# Patient Record
Sex: Male | Born: 1975
Health system: Southern US, Community
[De-identification: ages and names within clinical notes are randomized; demographics above are authoritative.]

## PROBLEM LIST (undated history)

## (undated) DIAGNOSIS — I10 Essential (primary) hypertension: Secondary | ICD-10-CM

## (undated) HISTORY — PX: APPENDECTOMY: SHX54

## (undated) HISTORY — PX: ABDOMINAL SURGERY: SHX537

## (undated) HISTORY — PX: HERNIA REPAIR: SHX51

---

## 2005-04-04 ENCOUNTER — Emergency Department (HOSPITAL_COMMUNITY): Admission: EM | Admit: 2005-04-04 | Discharge: 2005-04-04 | Payer: Self-pay | Admitting: Emergency Medicine

## 2005-07-26 ENCOUNTER — Ambulatory Visit: Payer: Self-pay | Admitting: *Deleted

## 2005-07-26 ENCOUNTER — Ambulatory Visit: Payer: Self-pay | Admitting: Family Medicine

## 2005-07-28 ENCOUNTER — Ambulatory Visit (HOSPITAL_COMMUNITY): Admission: RE | Admit: 2005-07-28 | Discharge: 2005-07-28 | Payer: Self-pay | Admitting: Internal Medicine

## 2005-07-29 ENCOUNTER — Ambulatory Visit (HOSPITAL_COMMUNITY): Admission: RE | Admit: 2005-07-29 | Discharge: 2005-07-29 | Payer: Self-pay | Admitting: Internal Medicine

## 2005-08-02 ENCOUNTER — Ambulatory Visit: Payer: Self-pay | Admitting: Family Medicine

## 2005-08-15 ENCOUNTER — Ambulatory Visit: Payer: Self-pay | Admitting: Family Medicine

## 2005-09-19 ENCOUNTER — Ambulatory Visit: Payer: Self-pay | Admitting: Internal Medicine

## 2005-10-03 ENCOUNTER — Ambulatory Visit: Payer: Self-pay | Admitting: Family Medicine

## 2005-11-04 ENCOUNTER — Emergency Department (HOSPITAL_COMMUNITY): Admission: EM | Admit: 2005-11-04 | Discharge: 2005-11-04 | Payer: Self-pay | Admitting: Emergency Medicine

## 2005-11-20 ENCOUNTER — Ambulatory Visit: Payer: Self-pay | Admitting: Family Medicine

## 2005-12-31 ENCOUNTER — Emergency Department (HOSPITAL_COMMUNITY): Admission: EM | Admit: 2005-12-31 | Discharge: 2005-12-31 | Payer: Self-pay | Admitting: Emergency Medicine

## 2006-06-13 ENCOUNTER — Ambulatory Visit: Payer: Self-pay | Admitting: Family Medicine

## 2006-06-30 ENCOUNTER — Ambulatory Visit: Payer: Self-pay | Admitting: Nurse Practitioner

## 2006-06-30 ENCOUNTER — Encounter: Admission: RE | Admit: 2006-06-30 | Discharge: 2006-09-28 | Payer: Self-pay | Admitting: *Deleted

## 2007-01-07 ENCOUNTER — Encounter (INDEPENDENT_AMBULATORY_CARE_PROVIDER_SITE_OTHER): Payer: Self-pay | Admitting: *Deleted

## 2007-12-11 ENCOUNTER — Emergency Department (HOSPITAL_BASED_OUTPATIENT_CLINIC_OR_DEPARTMENT_OTHER): Admission: EM | Admit: 2007-12-11 | Discharge: 2007-12-12 | Payer: Self-pay | Admitting: Emergency Medicine

## 2008-02-25 ENCOUNTER — Emergency Department (HOSPITAL_BASED_OUTPATIENT_CLINIC_OR_DEPARTMENT_OTHER): Admission: EM | Admit: 2008-02-25 | Discharge: 2008-02-25 | Payer: Self-pay | Admitting: Emergency Medicine

## 2008-04-10 ENCOUNTER — Emergency Department (HOSPITAL_BASED_OUTPATIENT_CLINIC_OR_DEPARTMENT_OTHER): Admission: EM | Admit: 2008-04-10 | Discharge: 2008-04-10 | Payer: Self-pay | Admitting: Emergency Medicine

## 2008-04-10 ENCOUNTER — Ambulatory Visit: Payer: Self-pay | Admitting: Diagnostic Radiology

## 2008-05-24 ENCOUNTER — Ambulatory Visit: Payer: Self-pay | Admitting: Diagnostic Radiology

## 2008-05-24 ENCOUNTER — Emergency Department (HOSPITAL_BASED_OUTPATIENT_CLINIC_OR_DEPARTMENT_OTHER): Admission: EM | Admit: 2008-05-24 | Discharge: 2008-05-24 | Payer: Self-pay | Admitting: Emergency Medicine

## 2008-06-04 ENCOUNTER — Emergency Department (HOSPITAL_BASED_OUTPATIENT_CLINIC_OR_DEPARTMENT_OTHER): Admission: EM | Admit: 2008-06-04 | Discharge: 2008-06-04 | Payer: Self-pay | Admitting: Emergency Medicine

## 2008-06-04 ENCOUNTER — Ambulatory Visit: Payer: Self-pay | Admitting: Diagnostic Radiology

## 2008-07-08 ENCOUNTER — Emergency Department (HOSPITAL_BASED_OUTPATIENT_CLINIC_OR_DEPARTMENT_OTHER): Admission: EM | Admit: 2008-07-08 | Discharge: 2008-07-08 | Payer: Self-pay | Admitting: Emergency Medicine

## 2010-05-22 NOTE — Miscellaneous (Signed)
Summary: VIP  Patient: Jimmy Wiggins [facility] Note: All result statuses are Final unless otherwise noted.  Tests: (1) VIP (Medications)   LLIMPORTMEDS              "Result Below..."       RESULT: FAMOTIDINE TABS 20 MG*TAKE ONE (1) TABLET TWICE DAILY*06/13/2006*Last Refill: [id number]***   LLIMPORTALLS              NKDA***  Note: An exclamation mark (!) indicates a result that was not dispersed into the flowsheet. Document Creation Date: 02/19/2007 3:02 PM _______________________________________________________________________  (1) Order result status: Final Collection or observation date-time: 01/07/2007 Requested date-time: 01/07/2007 Receipt date-time:  Reported date-time: 01/07/2007 Referring Physician:   Ordering Physician:   Specimen Source:  Source: [name] Order Number:  Lab site:

## 2010-08-07 LAB — BASIC METABOLIC PANEL
BUN: 18 mg/dL (ref 6–23)
BUN: 24 mg/dL — ABNORMAL HIGH (ref 6–23)
CO2: 23 mEq/L (ref 19–32)
CO2: 25 mEq/L (ref 19–32)
Calcium: 8.9 mg/dL (ref 8.4–10.5)
Calcium: 9.3 mg/dL (ref 8.4–10.5)
Chloride: 105 mEq/L (ref 96–112)
Chloride: 105 mEq/L (ref 96–112)
Creatinine, Ser: 0.7 mg/dL (ref 0.4–1.5)
Creatinine, Ser: 1 mg/dL (ref 0.4–1.5)
GFR calc Af Amer: >60 mL/min (ref >60)
GFR calc Af Amer: >60 mL/min (ref >60)
GFR calc non Af Amer: >60 mL/min (ref >60)
GFR calc non Af Amer: >60 mL/min (ref >60)
Glucose, Bld: 93 mg/dL (ref 70–99)
Glucose, Bld: 99 mg/dL (ref 70–99)
Potassium: 4 mEq/L (ref 3.5–5.1)
Potassium: 4.1 mEq/L (ref 3.5–5.1)
Sodium: 138 mEq/L (ref 135–145)
Sodium: 140 mEq/L (ref 135–145)

## 2010-08-07 LAB — CBC
HCT: 45.1 % (ref 39.0–52.0)
HCT: 45.8 % (ref 39.0–52.0)
Hemoglobin: 15.7 g/dL (ref 13.0–17.0)
Hemoglobin: 15.8 g/dL (ref 13.0–17.0)
MCHC: 34.3 g/dL (ref 30.0–36.0)
MCHC: 34.9 g/dL (ref 30.0–36.0)
MCV: 93 fL (ref 78.0–100.0)
MCV: 93.1 fL (ref 78.0–100.0)
Platelets: 276 10*3/uL (ref 150–400)
Platelets: 283 10*3/uL (ref 150–400)
RBC: 4.86 MIL/uL (ref 4.22–5.81)
RBC: 4.92 MIL/uL (ref 4.22–5.81)
RDW: 11.8 % (ref 11.5–15.5)
RDW: 12 % (ref 11.5–15.5)
WBC: 13.6 10*3/uL — ABNORMAL HIGH (ref 4.0–10.5)
WBC: 13.8 10*3/uL — ABNORMAL HIGH (ref 4.0–10.5)

## 2010-08-07 LAB — POCT CARDIAC MARKERS
CKMB, poc: 3.9 ng/mL (ref 1.0–8.0)
CKMB, poc: 5.2 ng/mL (ref 1.0–8.0)
CKMB, poc: <1 ng/mL — ABNORMAL LOW (ref 1.0–8.0)
CKMB, poc: <1 ng/mL — ABNORMAL LOW (ref 1.0–8.0)
Myoglobin, poc: 279 ng/mL (ref 12–200)
Myoglobin, poc: 32.1 ng/mL (ref 12–200)
Myoglobin, poc: 38.2 ng/mL (ref 12–200)
Myoglobin, poc: 498 ng/mL (ref 12–200)
Troponin i, poc: <0.05 ng/mL (ref 0.00–0.09)
Troponin i, poc: <0.05 ng/mL (ref 0.00–0.09)
Troponin i, poc: <0.05 ng/mL (ref 0.00–0.09)
Troponin i, poc: <0.05 ng/mL (ref 0.00–0.09)

## 2010-08-07 LAB — DIFFERENTIAL
Basophils Absolute: 0.1 10*3/uL (ref 0.0–0.1)
Basophils Absolute: 0.3 10*3/uL — ABNORMAL HIGH (ref 0.0–0.1)
Basophils Relative: 1 % (ref 0–1)
Basophils Relative: 2 % — ABNORMAL HIGH (ref 0–1)
Eosinophils Absolute: 0.4 10*3/uL (ref 0.0–0.7)
Eosinophils Absolute: 0.5 10*3/uL (ref 0.0–0.7)
Eosinophils Relative: 3 % (ref 0–5)
Eosinophils Relative: 3 % (ref 0–5)
Lymphocytes Relative: 20 % (ref 12–46)
Lymphocytes Relative: 28 % (ref 12–46)
Lymphs Abs: 2.8 10*3/uL (ref 0.7–4.0)
Lymphs Abs: 3.8 10*3/uL (ref 0.7–4.0)
Monocytes Absolute: 0.7 10*3/uL (ref 0.1–1.0)
Monocytes Absolute: 1.3 10*3/uL — ABNORMAL HIGH (ref 0.1–1.0)
Monocytes Relative: 5 % (ref 3–12)
Monocytes Relative: 9 % (ref 3–12)
Neutro Abs: 8.5 10*3/uL — ABNORMAL HIGH (ref 1.7–7.7)
Neutro Abs: 9 10*3/uL — ABNORMAL HIGH (ref 1.7–7.7)
Neutrophils Relative %: 63 % (ref 43–77)
Neutrophils Relative %: 66 % (ref 43–77)

## 2010-08-07 LAB — HEMOCCULT GUIAC POC 1CARD (OFFICE): Fecal Occult Bld: NEGATIVE

## 2010-08-07 LAB — MONONUCLEOSIS SCREEN: Mono Screen: NEGATIVE

## 2010-08-07 LAB — ETHANOL: Alcohol, Ethyl (B): <5 mg/dL (ref 0–10)

## 2010-08-09 ENCOUNTER — Emergency Department (HOSPITAL_BASED_OUTPATIENT_CLINIC_OR_DEPARTMENT_OTHER)
Admission: EM | Admit: 2010-08-09 | Discharge: 2010-08-09 | Disposition: A | Discharge status: Home / Self Care | Payer: Self-pay | Attending: Emergency Medicine | Admitting: Emergency Medicine

## 2010-08-09 DIAGNOSIS — I10 Essential (primary) hypertension: Secondary | ICD-10-CM | POA: Insufficient documentation

## 2010-08-09 DIAGNOSIS — K0381 Cracked tooth: Secondary | ICD-10-CM | POA: Insufficient documentation

## 2010-08-09 DIAGNOSIS — F17200 Nicotine dependence, unspecified, uncomplicated: Secondary | ICD-10-CM | POA: Insufficient documentation

## 2010-08-09 DIAGNOSIS — K089 Disorder of teeth and supporting structures, unspecified: Secondary | ICD-10-CM | POA: Insufficient documentation

## 2010-08-09 LAB — BASIC METABOLIC PANEL
BUN: 19 mg/dL (ref 6–23)
CO2: 23 mEq/L (ref 19–32)
Calcium: 9.1 mg/dL (ref 8.4–10.5)
Chloride: 107 mEq/L (ref 96–112)
Creatinine, Ser: 0.7 mg/dL (ref 0.4–1.5)
GFR calc Af Amer: >60 mL/min (ref >60)
GFR calc non Af Amer: >60 mL/min (ref >60)
Glucose, Bld: 114 mg/dL — ABNORMAL HIGH (ref 70–99)
Potassium: 4 mEq/L (ref 3.5–5.1)
Sodium: 144 mEq/L (ref 135–145)

## 2010-10-30 ENCOUNTER — Emergency Department (INDEPENDENT_AMBULATORY_CARE_PROVIDER_SITE_OTHER): Payer: Medicaid Other

## 2010-10-30 ENCOUNTER — Emergency Department (HOSPITAL_BASED_OUTPATIENT_CLINIC_OR_DEPARTMENT_OTHER)
Admission: EM | Admit: 2010-10-30 | Discharge: 2010-10-30 | Disposition: A | Discharge status: Home / Self Care | Payer: Medicaid Other | Attending: Emergency Medicine | Admitting: Emergency Medicine

## 2010-10-30 ENCOUNTER — Encounter: Payer: Self-pay | Admitting: *Deleted

## 2010-10-30 DIAGNOSIS — R930 Abnormal findings on diagnostic imaging of skull and head, not elsewhere classified: Secondary | ICD-10-CM

## 2010-10-30 DIAGNOSIS — F17200 Nicotine dependence, unspecified, uncomplicated: Secondary | ICD-10-CM | POA: Insufficient documentation

## 2010-10-30 DIAGNOSIS — R51 Headache: Secondary | ICD-10-CM

## 2010-10-30 DIAGNOSIS — X58XXXA Exposure to other specified factors, initial encounter: Secondary | ICD-10-CM

## 2010-10-30 DIAGNOSIS — S0003XA Contusion of scalp, initial encounter: Secondary | ICD-10-CM | POA: Insufficient documentation

## 2010-10-30 DIAGNOSIS — H66019 Acute suppurative otitis media with spontaneous rupture of ear drum, unspecified ear: Secondary | ICD-10-CM

## 2010-10-30 DIAGNOSIS — IMO0002 Reserved for concepts with insufficient information to code with codable children: Secondary | ICD-10-CM | POA: Insufficient documentation

## 2010-10-30 DIAGNOSIS — I10 Essential (primary) hypertension: Secondary | ICD-10-CM | POA: Insufficient documentation

## 2010-10-30 DIAGNOSIS — Y9289 Other specified places as the place of occurrence of the external cause: Secondary | ICD-10-CM | POA: Insufficient documentation

## 2010-10-30 HISTORY — DX: Essential (primary) hypertension: I10

## 2010-10-30 MED ORDER — AMOXICILLIN 500 MG PO CAPS: 500.0000 mg | ORAL_CAPSULE | Freq: Three times a day (TID) | ORAL | Status: AC

## 2010-10-30 MED ORDER — NEOMYCIN-POLYMYXIN-HC 3.5-10000-1 OT SUSP: 4.0000 [drp] | Freq: Three times a day (TID) | OTIC | Status: AC

## 2010-10-30 NOTE — ED Notes (Signed)
States he was hit in the head with a metal pipe a few weeks ago. Since that time he has had headaches and fluid coming out of his ears.

## 2010-10-30 NOTE — ED Provider Notes (Signed)
History     Chief Complaint  Patient presents with  . Headache   Patient is a 35 y.o. male presenting with head injury. The history is provided by the patient.  Head Injury  Incident onset: 2.5 weeks ago. He came to the ER via walk-in. The injury mechanism was a direct blow. He lost consciousness for a period of less than one minute. There was no blood loss. The quality of the pain is described as sharp. The pain is at a severity of 4/10. The pain is mild. The pain has been fluctuating since the injury. Pertinent negatives include no numbness, no blurred vision, no vomiting, no tinnitus, no disorientation and no weakness. Associated symptoms comments: Headaches since injury and about 2 weeks ago right ear pain and drainage. He has tried NSAIDs for the symptoms. The treatment provided no relief.  Pt was at work pulling on a pipe and the pipe flew back and hit him in the left side of the head.  Past Medical History  Diagnosis Date  . Hypertension     Past Surgical History  Procedure Date  . Abdominal surgery   . Appendectomy   . Hernia repair     No family history on file.  History  Substance Use Topics  . Smoking status: Current Everyday Smoker -- 1.0 packs/day  . Smokeless tobacco: Never Used  . Alcohol Use: No      Review of Systems  HENT: Positive for ear pain and ear discharge. Negative for tinnitus.        Hearing loss on the right  Eyes: Negative for blurred vision.  Gastrointestinal: Negative for vomiting.  Neurological: Negative for weakness and numbness.  All other systems reviewed and are negative.    Physical Exam  BP 229/128  Pulse 70  Temp(Src) 97.9 F (36.6 C) (Oral)  Resp 20  SpO2 99%  Physical Exam  Nursing note and vitals reviewed. Constitutional: He is oriented to person, place, and time. He appears well-developed and well-nourished. No distress.  HENT:  Head: Normocephalic.  Right Ear: There is drainage and swelling. Tympanic membrane is  perforated. No middle ear effusion. Decreased hearing is noted.  Left Ear: External ear normal.  Nose: Nose normal.       Exudate in the right ear canal and mild tenderness to movement of auricle, small healing hematoma to the left upper forehead  Eyes: EOM are normal. Pupils are equal, round, and reactive to light.  Neck: Normal range of motion. Neck supple.  Musculoskeletal: He exhibits no edema and no tenderness.  Neurological: He is alert and oriented to person, place, and time. No cranial nerve deficit.  Skin: Skin is warm and dry.    ED Course  Procedures  MDM Pt with injury to the head with a pipe over 2 weeks ago and was dazed and maybe brief LOC but since has had intermittant HA almost every day not worse with any certain position and no visual or neuro changes but states also about 2 weeks ago has stated to have drainage out of the right ear with pain.  Healing hematoma over the left forehead.  Right ear with exudate in the canal and signs of perforated TM. Will get head CT to evaluated where injured and will treat for Otitis External/media.  CT neg.  Will place on abx for ear.    Gwyneth Sprout, MD 10/30/10 2045

## 2011-01-22 LAB — COMPREHENSIVE METABOLIC PANEL
ALT: 23
AST: 25
Albumin: 4.5
Alkaline Phosphatase: 69
BUN: 21
CO2: 27
Calcium: 9.8
Chloride: 104
Creatinine, Ser: 0.9
GFR calc Af Amer: >60
GFR calc non Af Amer: >60
Glucose, Bld: 93
Potassium: 4
Sodium: 140
Total Bilirubin: 1.2
Total Protein: 7.3

## 2011-02-04 ENCOUNTER — Emergency Department (HOSPITAL_COMMUNITY)
Admission: EM | Admit: 2011-02-04 | Discharge: 2011-02-04 | Disposition: A | Discharge status: Home / Self Care | Payer: Medicaid Other | Attending: Emergency Medicine | Admitting: Emergency Medicine

## 2011-02-04 DIAGNOSIS — R51 Headache: Secondary | ICD-10-CM | POA: Insufficient documentation

## 2011-02-04 DIAGNOSIS — Z79899 Other long term (current) drug therapy: Secondary | ICD-10-CM | POA: Insufficient documentation

## 2011-02-04 DIAGNOSIS — I10 Essential (primary) hypertension: Secondary | ICD-10-CM | POA: Insufficient documentation

## 2011-02-04 LAB — POCT I-STAT, CHEM 8
BUN: 19 mg/dL (ref 6–23)
Calcium, Ion: 1.19 mmol/L (ref 1.12–1.32)
Chloride: 105 mEq/L (ref 96–112)
Creatinine, Ser: 0.9 mg/dL (ref 0.50–1.35)
Glucose, Bld: 137 mg/dL — ABNORMAL HIGH (ref 70–99)
HCT: 50 % (ref 39.0–52.0)
Hemoglobin: 17 g/dL (ref 13.0–17.0)
Potassium: 3.9 mEq/L (ref 3.5–5.1)
Sodium: 139 mEq/L (ref 135–145)
TCO2: 23 mmol/L (ref 0–100)

## 2011-02-04 LAB — URINALYSIS, ROUTINE W REFLEX MICROSCOPIC
Bilirubin Urine: NEGATIVE
Glucose, UA: NEGATIVE mg/dL
Hgb urine dipstick: NEGATIVE
Ketones, ur: NEGATIVE mg/dL
Leukocytes, UA: NEGATIVE
Nitrite: NEGATIVE
Protein, ur: NEGATIVE mg/dL
Specific Gravity, Urine: 1.03 (ref 1.005–1.030)
Urobilinogen, UA: 1 mg/dL (ref 0.0–1.0)
pH: 6 (ref 5.0–8.0)

## 2011-06-10 ENCOUNTER — Emergency Department (INDEPENDENT_AMBULATORY_CARE_PROVIDER_SITE_OTHER): Payer: Medicaid Other

## 2011-06-10 ENCOUNTER — Encounter (HOSPITAL_BASED_OUTPATIENT_CLINIC_OR_DEPARTMENT_OTHER): Payer: Self-pay

## 2011-06-10 ENCOUNTER — Emergency Department (HOSPITAL_BASED_OUTPATIENT_CLINIC_OR_DEPARTMENT_OTHER)
Admission: EM | Admit: 2011-06-10 | Discharge: 2011-06-10 | Disposition: A | Discharge status: Home / Self Care | Payer: Medicaid Other | Attending: Emergency Medicine | Admitting: Emergency Medicine

## 2011-06-10 DIAGNOSIS — M7989 Other specified soft tissue disorders: Secondary | ICD-10-CM

## 2011-06-10 DIAGNOSIS — M25569 Pain in unspecified knee: Secondary | ICD-10-CM

## 2011-06-10 DIAGNOSIS — M1990 Unspecified osteoarthritis, unspecified site: Secondary | ICD-10-CM

## 2011-06-10 DIAGNOSIS — M1710 Unilateral primary osteoarthritis, unspecified knee: Secondary | ICD-10-CM | POA: Insufficient documentation

## 2011-06-10 DIAGNOSIS — IMO0002 Reserved for concepts with insufficient information to code with codable children: Secondary | ICD-10-CM

## 2011-06-10 DIAGNOSIS — F17200 Nicotine dependence, unspecified, uncomplicated: Secondary | ICD-10-CM | POA: Insufficient documentation

## 2011-06-10 DIAGNOSIS — I10 Essential (primary) hypertension: Secondary | ICD-10-CM

## 2011-06-10 LAB — BASIC METABOLIC PANEL WITH GFR
BUN: 18 mg/dL (ref 6–23)
CO2: 28 meq/L (ref 19–32)
Calcium: 9.5 mg/dL (ref 8.4–10.5)
Chloride: 104 meq/L (ref 96–112)
Creatinine, Ser: 1 mg/dL (ref 0.50–1.35)
GFR calc Af Amer: >90 mL/min
GFR calc non Af Amer: >90 mL/min
Glucose, Bld: 162 mg/dL — ABNORMAL HIGH (ref 70–99)
Potassium: 4 meq/L (ref 3.5–5.1)
Sodium: 141 meq/L (ref 135–145)

## 2011-06-10 LAB — CBC
HCT: 44.1 % (ref 39.0–52.0)
Hemoglobin: 15.4 g/dL (ref 13.0–17.0)
MCH: 32 pg (ref 26.0–34.0)
MCHC: 34.9 g/dL (ref 30.0–36.0)
MCV: 91.7 fL (ref 78.0–100.0)
Platelets: 267 K/uL (ref 150–400)
RBC: 4.81 MIL/uL (ref 4.22–5.81)
RDW: 13 % (ref 11.5–15.5)
WBC: 13.2 K/uL — ABNORMAL HIGH (ref 4.0–10.5)

## 2011-06-10 MED ORDER — OXYCODONE-ACETAMINOPHEN 5-325 MG PO TABS: 2.0000 | ORAL_TABLET | ORAL | Status: AC | PRN

## 2011-06-10 MED ORDER — OXYCODONE-ACETAMINOPHEN 5-325 MG PO TABS
2.0000 | ORAL_TABLET | Freq: Once | ORAL | Status: AC
  Administered 2011-06-10: 2 via ORAL
  Filled 2011-06-10: qty 2

## 2011-06-10 MED ORDER — HYDROCHLOROTHIAZIDE 25 MG PO TABS
12.5000 mg | ORAL_TABLET | Freq: Once | ORAL | Status: AC
  Administered 2011-06-10: 09:00:00 via ORAL
  Filled 2011-06-10: qty 1

## 2011-06-10 MED ORDER — LISINOPRIL 10 MG PO TABS
10.0000 mg | ORAL_TABLET | Freq: Once | ORAL | Status: AC
  Administered 2011-06-10: 10 mg via ORAL
  Filled 2011-06-10: qty 1

## 2011-06-10 NOTE — Discharge Instructions (Signed)
Mr. Jimmy Wiggins he has osteoarthritis in your right knee. He can take Tylenol up to 900 mg every 6 hours for this. He can take the Percocet if you have trouble sleeping at night because of the pain. Do not drive with Percocet. Your blood pressure in the ER today was 201/123. We gave you lisinopril and hydrochlorothiazide your regular hypertension medications which brought it down to 180/102. Get a primary care provider to follow you for your hypertension and osteoarthritis. Please take your hypertension medications see don't have a stroke. Return for severe pain weakness or any other concerns.

## 2011-06-10 NOTE — ED Notes (Signed)
Patient transported to X-ray 

## 2011-06-10 NOTE — ED Provider Notes (Signed)
Patient seen and examined, mild tenderness right knee.  X-rays c.w. Osteoarthritis.   Hilario Quarry, MD 06/10/11 [phone]

## 2011-06-10 NOTE — ED Provider Notes (Signed)
History     CSN: [id number]  Arrival date & time 06/10/11  0756   None     Chief Complaint  Patient presents with  . Knee Pain    (Consider location/radiation/quality/duration/timing/severity/associated sxs/prior treatment) Patient is a 36 y.o. male presenting with knee pain. The history is provided by the patient. No language interpreter was used.  Knee Pain This is a new problem. The current episode started 1 to 4 weeks ago. The problem occurs daily. The problem has been gradually worsening. Associated symptoms include arthralgias. Pertinent negatives include no chest pain, fever, joint swelling, numbness or weakness.   Patient presents today with right knee pain x2 weeks it is progressively getting worse. 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 States that the pain is worse at the end of the day. He is taking ibuprofen Tylenol and Aleve. The Aleve seems to help the pain some but the pain is now waking him up at night even when his knees touch each other. Blood pressure today is 201/123. Patient states to be on lisinopril and hydrochlorothiazide but has failed to take it for 2 weeks because it makes him feel  "dizzy". States that he does have Medicaid but they sent him to a woman[facility] clinic because they thought he was a male. Is in the process of getting of PCP. We will get films of his knee and try to get his blood pressure under control in the ER today. We will also check an i-STAT to make sure his renal function is normal. Negative for stroke like symptoms. Neuro in tact. Patient having pain with palpatation to lateral and superior patella.  Also pain with external rotation of the R foot.   Past Medical History  Diagnosis Date  . Hypertension     Past Surgical History  Procedure Date  . Abdominal surgery   . Appendectomy   . Hernia repair     No family history on file.  History  Substance Use Topics  . Smoking status: Current Everyday Smoker --  1.0 packs/day  . Smokeless tobacco: Never Used  . Alcohol Use: No      Review of Systems  Constitutional: Negative for fever.  Cardiovascular: Negative for chest pain and palpitations.  Musculoskeletal: Positive for arthralgias. Negative for back pain, joint swelling and gait problem.  Neurological: Negative for weakness and numbness.  All other systems reviewed and are negative.    Allergies  Review of patient's allergies indicates no known allergies.  Home Medications   Current Outpatient Rx  Name Route Sig Dispense Refill  . LISINOPRIL PO Oral Take by mouth.      BP 201/123  Pulse 81  Temp(Src) 98.4 F (36.9 C) (Oral)  Resp 16  Ht 6' (1.829 m)  Wt 225 lb (102.059 kg)  BMI 30.52 kg/m2  SpO2 99%  Physical Exam  Nursing note and vitals reviewed. Constitutional: He is oriented to person, place, and time. He appears well-developed and well-nourished.       obese  HENT:  Head: Normocephalic and atraumatic.  Eyes: Pupils are equal, round, and reactive to light.  Neck: Normal range of motion. Neck supple.  Cardiovascular: Normal rate and regular rhythm.  Exam reveals no gallop and no friction rub.   No murmur heard. Pulmonary/Chest: Breath sounds normal. No respiratory distress.  Abdominal: Soft. He exhibits no distension.  Musculoskeletal: Normal range of motion. He exhibits tenderness. He exhibits no edema.  Right knee tenderness with palpitation to the left lateral patella and superior patella. Pain with external rotation of the right foot.  Neurological: He is alert and oriented to person, place, and time. No cranial nerve deficit.  Skin: Skin is warm and dry.  Psychiatric: He has a normal mood and affect.    ED Course  Procedures (including critical care time)  Labs Reviewed - No data to display No results found.   No diagnosis found.    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 X-ray shows osteoarthritis. Noncompliant  with blood pressure medication x 2 weeks because it makes him feel "weird"  Initial blood pressure was 201/123. No  Stroke like symptoms shortness of breath or chest pain. Continue lisinopril and hydrochlorothiazide and his blood pressure came down to 180/103. Plans to get a PCP this week. Return if severe pain or weakness. Will take Tylenol or Percocet for his osteoarthritis.  Labs Reviewed  BASIC METABOLIC PANEL - Abnormal; Notable for the following:    Glucose, Bld 162 (*)    All other components within normal limits  CBC - Abnormal; Notable for the following:    WBC 13.2 (*)    All other components within normal limits        Jethro Bastos, NP 06/10/11 1036  Jethro Bastos, NP 06/10/11 1036

## 2011-06-10 NOTE — ED Notes (Signed)
Pt c/o R knee pain for past couple of weeks.  Denies injury.  Pt states he has been using Aleve with minimal relief.  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

## 2017-08-04 ENCOUNTER — Emergency Department (INDEPENDENT_AMBULATORY_CARE_PROVIDER_SITE_OTHER): Payer: BLUE CROSS/BLUE SHIELD

## 2017-08-04 ENCOUNTER — Encounter: Payer: Self-pay | Admitting: Emergency Medicine

## 2017-08-04 ENCOUNTER — Emergency Department (INDEPENDENT_AMBULATORY_CARE_PROVIDER_SITE_OTHER)
Admission: EM | Admit: 2017-08-04 | Discharge: 2017-08-04 | Disposition: A | Discharge status: Home / Self Care | Payer: BLUE CROSS/BLUE SHIELD | Source: Home / Self Care | Attending: Family Medicine | Admitting: Family Medicine

## 2017-08-04 DIAGNOSIS — M1711 Unilateral primary osteoarthritis, right knee: Secondary | ICD-10-CM

## 2017-08-04 DIAGNOSIS — M25461 Effusion, right knee: Secondary | ICD-10-CM

## 2017-08-04 DIAGNOSIS — I10 Essential (primary) hypertension: Secondary | ICD-10-CM

## 2017-08-04 MED ORDER — HYDROCHLOROTHIAZIDE 12.5 MG PO TABS: 12.5000 mg | ORAL_TABLET | ORAL | 1 refills | Status: DC

## 2017-08-04 MED ORDER — HYDROCODONE-ACETAMINOPHEN 5-325 MG PO TABS: 1.0000 | ORAL_TABLET | Freq: Four times a day (QID) | ORAL | 0 refills | Status: AC | PRN

## 2017-08-04 NOTE — ED Provider Notes (Signed)
[facility]    CSN: [id number] Arrival date & time: 08/04/17  1738     History   Chief Complaint Chief Complaint  Patient presents with  . Knee Pain    HPI Jimmy Wiggins is a 42 y.o. male.   Patient complains of gradually increasing right knee pain/swelling for about a month, with no preceding injury.  The knee often feels if it may lock up.  He has been wearing an OTC brace which has not been very helpful.  He works in [employer], and is having difficulty working because of knee pain. He admits that he has a long history of hypertension but has not been able to tolerated various anti-hypertensive medications.  He last took lisinopril (not taking now) and he discontinued it because he felt dizzy and light-headed with rapid movement, especially standing.  He denies chest pain, shortness of breath, palpitations.  He continues to smoke.  The history is provided by the patient and the spouse.  Knee Pain  Location:  Knee Time since incident:  1 month Injury: no   Knee location:  R knee Pain details:    Quality:  Aching   Radiates to:  Does not radiate   Severity:  Moderate   Onset quality:  Gradual   Duration:  1 month   Timing:  Constant   Progression:  Worsening Chronicity:  Recurrent Dislocation: no   Prior injury to area:  No Relieved by:  Nothing Worsened by:  Bearing weight, flexion and extension Associated symptoms: decreased ROM, stiffness and swelling   Associated symptoms: no back pain, no fatigue, no fever, no muscle weakness, no numbness and no tingling     Past Medical History:  Diagnosis Date  . Hypertension     There are no active problems to display for this patient.   Past Surgical History:  Procedure Laterality Date  . ABDOMINAL SURGERY    . APPENDECTOMY    . HERNIA REPAIR         Home Medications    Prior to Admission medications   Medication Sig Start Date End Date Taking? Authorizing Provider  hydrochlorothiazide (HYDRODIURIL)  12.5 MG tablet Take 1 tablet (12.5 mg total) by mouth every morning. 08/04/17   Lattie HawBeese, Kyndall Chaplin A, MD  LISINOPRIL PO Take by mouth.    [provider]    Family History History reviewed. No pertinent family history.  Social History Social History   Tobacco Use  . Smoking status: Current Every Day Smoker    Packs/day: 1.00  . Smokeless tobacco: Never Used  Substance Use Topics  . Alcohol use: Yes  . Drug use: No     Allergies   Patient has no known allergies.   Review of Systems Review of Systems  Constitutional: Negative for fatigue and fever.  Respiratory: Negative for cough, chest tightness, shortness of breath and wheezing.   Cardiovascular: Negative for chest pain, palpitations and leg swelling.  Musculoskeletal: Positive for stiffness. Negative for back pain.  All other systems reviewed and are negative.    Physical Exam Triage Vital Signs ED Triage Vitals  Enc Vitals Group     BP 08/04/17 1806 (!) 249/154     Pulse Rate 08/04/17 1806 68     Resp --      Temp 08/04/17 1806 98 F (36.7 C)     Temp Source 08/04/17 1806 Oral     SpO2 08/04/17 1806 98 %     Weight 08/04/17 1807 236 lb (107  kg)     Height --      Head Circumference --      Peak Flow --      Pain Score 08/04/17 1807 9     Pain Loc --      Pain Edu? --      Excl. in GC? --    No data found.  Updated Vital Signs BP (!) 249/154 (BP Location: Right Arm) Comment: physician notified   Pulse 68   Temp 98 F (36.7 C) (Oral)   Wt 236 lb (107 kg)   SpO2 98%   BMI 32.01 kg/m   Visual Acuity Right Eye Distance:   Left Eye Distance:   Bilateral Distance:    Right Eye Near:   Left Eye Near:    Bilateral Near:     Physical Exam  Constitutional: He appears well-developed and well-nourished. No distress.  HENT:  Head: Normocephalic.  Eyes: Pupils are equal, round, and reactive to light.  Cardiovascular: Normal rate.  Pulmonary/Chest: Effort normal.  Musculoskeletal: He exhibits  no edema.       Right shoulder: He exhibits decreased range of motion, tenderness, bony tenderness, swelling and effusion. He exhibits no crepitus.       Legs: Right knee:  Effusion present.  No erythema but knee mildly warm to palpation.  Knee stable, negative drawer test.  McMurray test negative.  Tenderness over lateral joint line.  Neurological: He is alert.  Skin: Skin is warm and dry.  Nursing note and vitals reviewed.    UC Treatments / Results  Labs (all labs ordered are listed, but only abnormal results are displayed) Labs Reviewed - No data to display  EKG None Radiology Dg Knee Complete 4 Views Right  Result Date: 08/04/2017 CLINICAL DATA:  42 year old male with right knee pain (around patella) and swelling for the past month. Denies injury. Initial encounter. EXAM: RIGHT KNEE - COMPLETE 4+ VIEW COMPARISON:  06/10/2011 plain film exam. FINDINGS: Very mild medial tibiofemoral joint degenerative changes. Minimal patellofemoral joint degenerative changes. Small joint effusion. No fracture or dislocation. IMPRESSION: Very mild medial tibiofemoral joint degenerative changes. Minimal patellofemoral joint degenerative changes. Small joint effusion. Electronically Signed   By: Lacy Duverney M.D.   On: 08/04/2017 19:11    Procedures Procedures (including critical care time)  Medications Ordered in UC Medications - No data to display   Initial Impression / Assessment and Plan / UC Course  I have reviewed the triage vital signs and the nursing notes.  Pertinent labs & imaging results that were available during my care of the patient were reviewed by me and considered in my medical decision making (see chart for details).    Dispensed hinged knee brace. Begin trial of HCTZ 12.5mg  AM Wear knee brace daytime. Apply ice pack for 20 to 30 minutes, 2 to 3 times daily  Continue until pain and swelling decrease.  Followup with Dr. Rodney Langton or Dr. Clementeen Graham ([facility]  Clinic) for joint aspiration and joint injection.   Followup with Family Doctor to establish care and manage severe hypertension.  Final Clinical Impressions(s) / UC Diagnoses   Final diagnoses:  Primary osteoarthritis of right knee  Essential hypertension    ED Discharge Orders        Ordered    hydrochlorothiazide (HYDRODIURIL) 12.5 MG tablet  BH-each morning     08/04/17 1937         Lattie Haw, MD 08/06/17 [phone]

## 2017-08-04 NOTE — Discharge Instructions (Addendum)
Wear knee brace daytime. Apply ice pack for 20 to 30 minutes, 2 to 3 times daily  Continue until pain and swelling decrease.  May take Tylenol for pain daytime. Begin hydrochlorothiazide on Sunday morning.

## 2017-08-04 NOTE — ED Triage Notes (Signed)
Pt c/o right knee pain and swelling x1 month. Denies injury. Using ice.

## 2017-08-11 ENCOUNTER — Ambulatory Visit (INDEPENDENT_AMBULATORY_CARE_PROVIDER_SITE_OTHER): Payer: BLUE CROSS/BLUE SHIELD | Admitting: Family Medicine

## 2017-08-11 ENCOUNTER — Other Ambulatory Visit: Payer: Self-pay

## 2017-08-11 ENCOUNTER — Encounter: Payer: Self-pay | Admitting: Family Medicine

## 2017-08-11 ENCOUNTER — Telehealth: Payer: Self-pay

## 2017-08-11 DIAGNOSIS — I10 Essential (primary) hypertension: Secondary | ICD-10-CM

## 2017-08-11 DIAGNOSIS — M25561 Pain in right knee: Secondary | ICD-10-CM | POA: Diagnosis not present

## 2017-08-11 MED ORDER — HYDROCHLOROTHIAZIDE 25 MG PO TABS: 25.0000 mg | ORAL_TABLET | Freq: Every day | ORAL | 1 refills | Status: DC

## 2017-08-11 MED ORDER — COLCHICINE 0.6 MG PO TABS: 0.6000 mg | ORAL_TABLET | Freq: Every day | ORAL | 2 refills | Status: AC | PRN

## 2017-08-11 MED ORDER — COLCHICINE 0.6 MG PO TABS: 0.6000 mg | ORAL_TABLET | Freq: Every day | ORAL | 2 refills | Status: DC | PRN

## 2017-08-11 NOTE — Telephone Encounter (Signed)
Pt called stating that his insurance is not covering his Colchicine that was called in today and it will cost him over $200.Marland Kitchen.  Pt wanting to know if there is something else this can be changed to   Change or initiate PA? Please advise

## 2017-08-11 NOTE — Telephone Encounter (Signed)
Pt advised. States he will try to pick up med by Friday. No further needs at this time.

## 2017-08-11 NOTE — Telephone Encounter (Signed)
Total cost of this medicine should be no more than $66.59 at [facility] using the good rx coupon.  I sent a new rx with the [facility] RX coupon info to [facility] [address] [facility] [place].  Cheaper alternaitves are not really safe with blood pressure that elevated.

## 2017-08-11 NOTE — Progress Notes (Signed)
Subjective:    I'm seeing this patient as a consultation for:  Dr Cathren HarshBeese  CC: Knee Pain  HPI: Patient was seen for right knee pain in urgent care several days ago. He was diagnosed with osteoarthritis and knee effusion. He is still having knee pain with movement and struggles to get stand up from a seated position due to pain. The pain is dull and in the middle of his knee. He notices some popping noises with knee extension. He denies trauma. Denies numbness.  The pain interferes with work and sleep.  The pain is been ongoing now for several months without injury.  Additionally he was found to have severe hypertension.  This is been an ongoing problem as well.  He is had scattered care and no real definitive follow-up for this.  He notes he had trouble tolerating blood pressure medications in the past.  He was prescribed hydrochlorothiazide 12.5 mg daily and for started taking it this morning.  He notes he is feeling a little bit dizzy.  Past medical history, Surgical history, Family history not pertinant except as noted below, Social history, Allergies, and medications have been entered into the medical record, reviewed, and no changes needed.   Review of Systems: No headache, visual changes, nausea, vomiting, diarrhea, constipation, dizziness, abdominal pain, skin rash, fevers, chills, night sweats, weight loss, swollen lymph nodes, body aches, joint swelling, muscle aches, chest pain, shortness of breath, mood changes, visual or auditory hallucinations.   Objective:    Vitals:   08/11/17 1058  BP: (!) 246/132  Pulse: 71  Resp: 18  Temp: (!) 97.5 F (36.4 C)  SpO2: 100%   Gen: Well NAD HEENT: EOMI,  MMM Lungs: Normal work of breathing. CTABL Heart: RRR no MRG Abd: NABS, Soft. Nondistended, Nontender Exts: Brisk capillary refill, warm and well perfused.    Right Knee: Mild effusion present. No erythema Mildly  tender to palpation throughout Crepitations felt with extension ROM limited due to pain  Ligaments in tact.  Strength 5/5  Procedure: Real-time Ultrasound Guided Injection of right knee  Device: GE Logiq E   Images permanently stored and available for review in the ultrasound unit. Verbal informed consent obtained.  Discussed risks and benefits of procedure. Warned about infection bleeding damage to structures skin hypopigmentation and fat atrophy among others. Patient expresses understanding and agreement Time-out conducted.   Noted no overlying erythema, induration, or other signs of local infection.   Skin prepped in a sterile fashion.   Local anesthesia: Topical Ethyl chloride.   With sterile technique and under real time ultrasound guidance:  80mg  kenalog and 4ml marcaine injected easily.   Completed without difficulty   Pain immediately resolved suggesting accurate placement of the medication.   Advised to call if fevers/chills, erythema, induration, drainage, or persistent bleeding.   Images permanently stored and available for review in the ultrasound unit.  Impression: Technically successful ultrasound guided injection.  EXAM: RIGHT KNEE - COMPLETE 4+ VIEW  COMPARISON:  06/10/2011 plain film exam.  FINDINGS: Very mild medial tibiofemoral joint degenerative changes.  Minimal patellofemoral joint degenerative changes.  Small joint effusion.  No fracture or dislocation.  IMPRESSION: Very mild medial tibiofemoral joint degenerative changes.  Minimal patellofemoral joint degenerative changes.  Small joint effusion.   Electronically Signed   By: Lacy DuverneySteven  Olson M.D.   On: 08/04/2017 19:11 I personally (independently) visualized and performed the interpretation of the images attached in this note.    Impression and  Recommendations:    Assessment and Plan: 42 y.o. male with  Knee pain: Patient has a small effusion as seen on ultrasound.  The patient's  history of spontaneous knee pain with no trauma and no significant osteoarthritis on X-ray puts Gout high on the differential. There was not enough fluid seen on ultrasound to drain, but we did perform a US guided steroid injection. We will preemptively treat for gout with colchicine. Patient should follow up in 2-3 weeks if he has no improvement.    Hypertension: Patient's BP is dangerously high. The patient states this is his baseline. He says prior doctors have prescribed medications that have made him feel dizzy and unsteady on his feet. Due to those symptoms, he has never been compliant with medications. If the patient, does not intervene on his blood pressure, he is at significant risk for a cardiovascular event. To avoid adverse effects, we will start with a very small dose of hydrochlorothiazide and titrate upwards to a full 25 mg dose. He will likely need more than one medication, but this will be a good starting point. .  Recheck in 1 month.  Plan to transition BP management to new PCP in this clinic.    No orders of the defined types were placed in this encounter.  Meds ordered this encounter  Medications  . colchicine 0.6 MG tablet    Sig: Take 1 tablet (0.6 mg total) by mouth daily as needed (for gout pain).    Dispense:  30 tablet    Refill:  2  . hydrochlorothiazide (HYDRODIURIL) 25 MG tablet    Sig: Take 1 tablet (25 mg total) by mouth daily.    Dispense:  30 tablet    Refill:  1    Discussed warning signs or symptoms. Please see discharge instructions. Patient expresses understanding.

## 2017-08-11 NOTE — Patient Instructions (Addendum)
Thank you for coming in today. Call or go to the ER if you develop a large red swollen joint with extreme pain or oozing puss.  Use colchicine daily as needed for gout pain.   Take 1/2 of the 12.5mg  HCTZ  Blood pressure pill.  Take 1/2 of the 25 mg pill when you run out of the 12.5/  After 1 week at 12.5 increase to a full 25mg  pill.  The reason for all this is to reduce your risk of heart attacks and stroke.  Recheck in 1 month.  At visit try to come fasting.     Gout Gout is painful swelling that can happen in some of your joints. Gout is a type of arthritis. This condition is caused by having too much uric acid in your body. Uric acid is a chemical that is made when your body breaks down substances called purines. If your body has too much uric acid, sharp crystals can form and build up in your joints. This causes pain and swelling. Gout attacks can happen quickly and be very painful (acute gout). Over time, the attacks can affect more joints and happen more often (chronic gout). Follow these instructions at home: During a Gout Attack  If directed, put ice on the painful area: ? Put ice in a plastic bag. ? Place a towel between your skin and the bag. ? Leave the ice on for 20 minutes, 2-3 times a day.  Rest the joint as much as possible. If the joint is in your leg, you may be given crutches to use.  Raise (elevate) the painful joint above the level of your heart as often as you can.  Drink enough fluids to keep your pee (urine) clear or pale yellow.  Take over-the-counter and prescription medicines only as told by your doctor.  Do not drive or use heavy machinery while taking prescription pain medicine.  Follow instructions from your doctor about what you can or cannot eat and drink.  Return to your normal activities as told by your doctor. Ask your doctor what activities are safe for you. Avoiding Future Gout Attacks  Follow a low-purine diet as told by a specialist  (dietitian) or your doctor. Avoid foods and drinks that have a lot of purines, such as: ? Liver. ? Kidney. ? Anchovies. ? Asparagus. ? Herring. ? Mushrooms ? Mussels. ? Beer.  Limit alcohol intake to no more than 1 drink a day for nonpregnant women and 2 drinks a day for men. One drink equals 12 oz of beer, 5 oz of wine, or 1 oz of hard liquor.  Stay at a healthy weight or lose weight if you are overweight. If you want to lose weight, talk with your doctor. It is important that you do not lose weight too fast.  Start or continue an exercise plan as told by your doctor.  Drink enough fluids to keep your pee clear or pale yellow.  Take over-the-counter and prescription medicines only as told by your doctor.  Keep all follow-up visits as told by your doctor. This is important. Contact a doctor if:  You have another gout attack.  You still have symptoms of a gout attack after10 days of treatment.  You have problems (side effects) because of your medicines.  You have chills or a fever.  You have burning pain when you pee (urinate).  You have pain in your lower back or belly. Get help right away if:  You have very bad pain.  Your pain cannot be controlled.  You cannot pee. This information is not intended to replace advice given to you by your health care provider. Make sure you discuss any questions you have with your health care provider. Document Released: 01/16/2008 Document Revised: 09/14/2015 Document Reviewed: 01/19/2015 Elsevier Interactive Patient Education  [employer]2018 [employer]

## 2017-09-10 ENCOUNTER — Encounter: Payer: Self-pay | Admitting: Family Medicine

## 2017-09-10 ENCOUNTER — Ambulatory Visit (INDEPENDENT_AMBULATORY_CARE_PROVIDER_SITE_OTHER): Payer: BLUE CROSS/BLUE SHIELD | Admitting: Family Medicine

## 2017-09-10 VITALS — BP 244/139 | HR 67 | Ht 72.0 in | Wt 237.0 lb

## 2017-09-10 DIAGNOSIS — M109 Gout, unspecified: Secondary | ICD-10-CM | POA: Diagnosis not present

## 2017-09-10 DIAGNOSIS — I10 Essential (primary) hypertension: Secondary | ICD-10-CM | POA: Diagnosis not present

## 2017-09-10 MED ORDER — LISINOPRIL-HYDROCHLOROTHIAZIDE 20-25 MG PO TABS: 1.0000 | ORAL_TABLET | Freq: Every day | ORAL | 0 refills | Status: AC

## 2017-09-10 MED ORDER — LISINOPRIL-HYDROCHLOROTHIAZIDE 20-25 MG PO TABS: 1.0000 | ORAL_TABLET | Freq: Every day | ORAL | 1 refills | Status: DC

## 2017-09-10 NOTE — Patient Instructions (Signed)
Thank you for coming in today. Get labs this weekend or early next week.  STOP HCTZ START Lisinopril/HCTZ We likely will adjust the gout medicine based on labs.   Recheck with me in 1-3 months.  If you are due to run out of medicine right before you come back let me know so I can make sure you have enough to get your to the appointment.   5. [facility] [address] [place], [place], [place] [address] Across from [facility]. **Saturday Walk-ins ONLY**

## 2017-09-10 NOTE — Progress Notes (Signed)
       Jimmy Wiggins is a 42 y.o. male who presents to [facility]: [facility] today for gout, hypertension.  Patient was seen April 22 for right knee pain and effusion thought to be gout flare.  He was treated with steroid injection and seen.  He notes he is much better now.  He notes he is using colchicine every few days as his pain will return.  He is quite satisfied with how he is doing.  Hypertension: Patient has profound severe hypertension.  He notes this is been ongoing for years.  At the last visit we started hydrochlorothiazide.  He takes a daily and is denies any chest pain palpitations lightheadedness or dizziness.   ROS as above:  Exam:  BP (!) 244/139   Pulse 67   Ht 6' (1.829 m)   Wt 237 lb (107.5 kg)   BMI 32.14 kg/m  Gen: Well NAD HEENT: EOMI,  MMM Lungs: Normal work of breathing. CTABL Heart: RRR no MRG Abd: NABS, Soft. Nondistended, Nontender Exts: Brisk capillary refill, warm and well perfused.  Right knee no effusion normal gait    Assessment and Plan: 42 y.o. male with  Right knee pain due to gout.  Gout management: Continue colchicine as needed.  Will check uric acid and metabolic panel.  Anticipate uric acid elevation at that time will prescribe allopurinol and continue colchicine daily prophylaxis for a few months.  Recheck in 3 months or sooner if needed.  Hypertension: Not well controlled.  Increase blood pressure regimen to include lisinopril.  Will prescribe combination of lisinopril/hydrochlorothiazide.  Check labs in the near future.  Recheck in 1 to 3 months.  Return sooner if needed.   Orders Placed This Encounter  Procedures  . CBC  . COMPLETE METABOLIC PANEL WITH GFR  . Uric acid   Meds ordered this encounter  Medications  . DISCONTD: lisinopril-hydrochlorothiazide (PRINZIDE,ZESTORETIC) 20-25 MG tablet    Sig: Take 1 tablet by mouth  daily.    Dispense:  30 tablet    Refill:  1  . lisinopril-hydrochlorothiazide (PRINZIDE,ZESTORETIC) 20-25 MG tablet    Sig: Take 1 tablet by mouth daily.    Dispense:  90 tablet    Refill:  0    Disregard 30 day supply sent 5/22     Historical information moved to improve visibility of documentation.  Past Medical History:  Diagnosis Date  . Hypertension    Past Surgical History:  Procedure Laterality Date  . ABDOMINAL SURGERY    . APPENDECTOMY    . HERNIA REPAIR     Social History   Tobacco Use  . Smoking status: Current Every Day Smoker    Packs/day: 1.00  . Smokeless tobacco: Never Used  Substance Use Topics  . Alcohol use: Yes   family history is not on file.  Medications: Current Outpatient Medications  Medication Sig Dispense Refill  . colchicine 0.6 MG tablet Take 1 tablet (0.6 mg total) by mouth daily as needed (for gout pain). 30 tablet 2  . lisinopril-hydrochlorothiazide (PRINZIDE,ZESTORETIC) 20-25 MG tablet Take 1 tablet by mouth daily. 90 tablet 0   No current facility-administered medications for this visit.    No Known Allergies  Health Maintenance Health Maintenance  Topic Date Due  . HIV Screening  06/17/1990  . TETANUS/TDAP  06/17/1994  . INFLUENZA VACCINE  11/20/2017    Discussed warning signs or symptoms. Please see discharge instructions. Patient expresses understanding.

## 2018-03-31 MED ORDER — BENZONATATE 100 MG PO CAPS
200.00 | ORAL_CAPSULE | ORAL | Status: DC
Start: 2018-03-31 — End: 2018-03-31

## 2018-03-31 MED ORDER — HYDROCHLOROTHIAZIDE 25 MG PO TABS
25.00 | ORAL_TABLET | ORAL | Status: DC
Start: 2018-04-01 — End: 2018-03-31

## 2018-03-31 MED ORDER — OXYCODONE HCL 5 MG PO TABS
5.00 | ORAL_TABLET | ORAL | Status: DC
Start: ? — End: 2018-03-31

## 2018-03-31 MED ORDER — CLONIDINE HCL 0.1 MG PO TABS
0.10 | ORAL_TABLET | ORAL | Status: DC
Start: 2018-03-31 — End: 2018-03-31

## 2018-03-31 MED ORDER — LABETALOL HCL 200 MG PO TABS
200.00 | ORAL_TABLET | ORAL | Status: DC
Start: 2018-03-31 — End: 2018-03-31

## 2018-03-31 MED ORDER — GUAIFENESIN 100 MG/5ML PO SYRP
400.00 | ORAL_SOLUTION | ORAL | Status: DC
Start: ? — End: 2018-03-31

## 2018-03-31 MED ORDER — LISINOPRIL 20 MG PO TABS
40.00 | ORAL_TABLET | ORAL | Status: DC
Start: 2018-04-01 — End: 2018-03-31

## 2018-03-31 MED ORDER — HYDRALAZINE HCL 20 MG/ML IJ SOLN
5.00 | INTRAMUSCULAR | Status: DC
Start: ? — End: 2018-03-31

## 2018-03-31 MED ORDER — ACETAMINOPHEN 325 MG PO TABS
650.00 | ORAL_TABLET | ORAL | Status: DC
Start: ? — End: 2018-03-31

## 2018-03-31 MED ORDER — ONDANSETRON HCL 4 MG/2ML IJ SOLN
4.00 | INTRAMUSCULAR | Status: DC
Start: ? — End: 2018-03-31

## 2019-09-09 IMAGING — DX DG KNEE COMPLETE 4+V*R*
4 series · 4 of 4 positions shown · non-contrast
Comparison: 06/10/2011 plain film exam.

CLINICAL DATA: 42-year-old male with right knee pain (around
patella) and swelling for the past month. Denies injury. Initial
encounter.

EXAM:
RIGHT KNEE - COMPLETE 4+ VIEW

[knee ap]
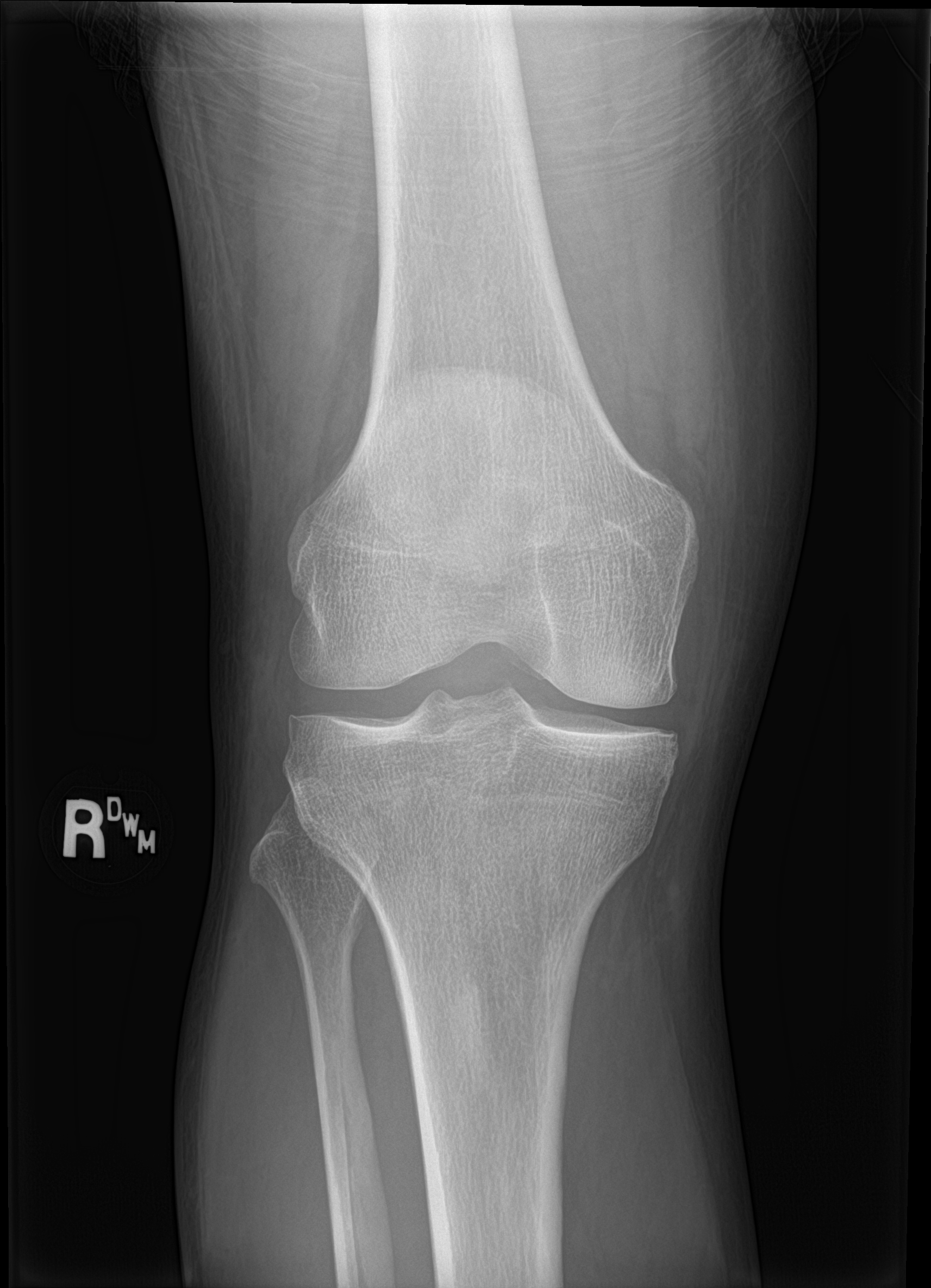

[knee lat]
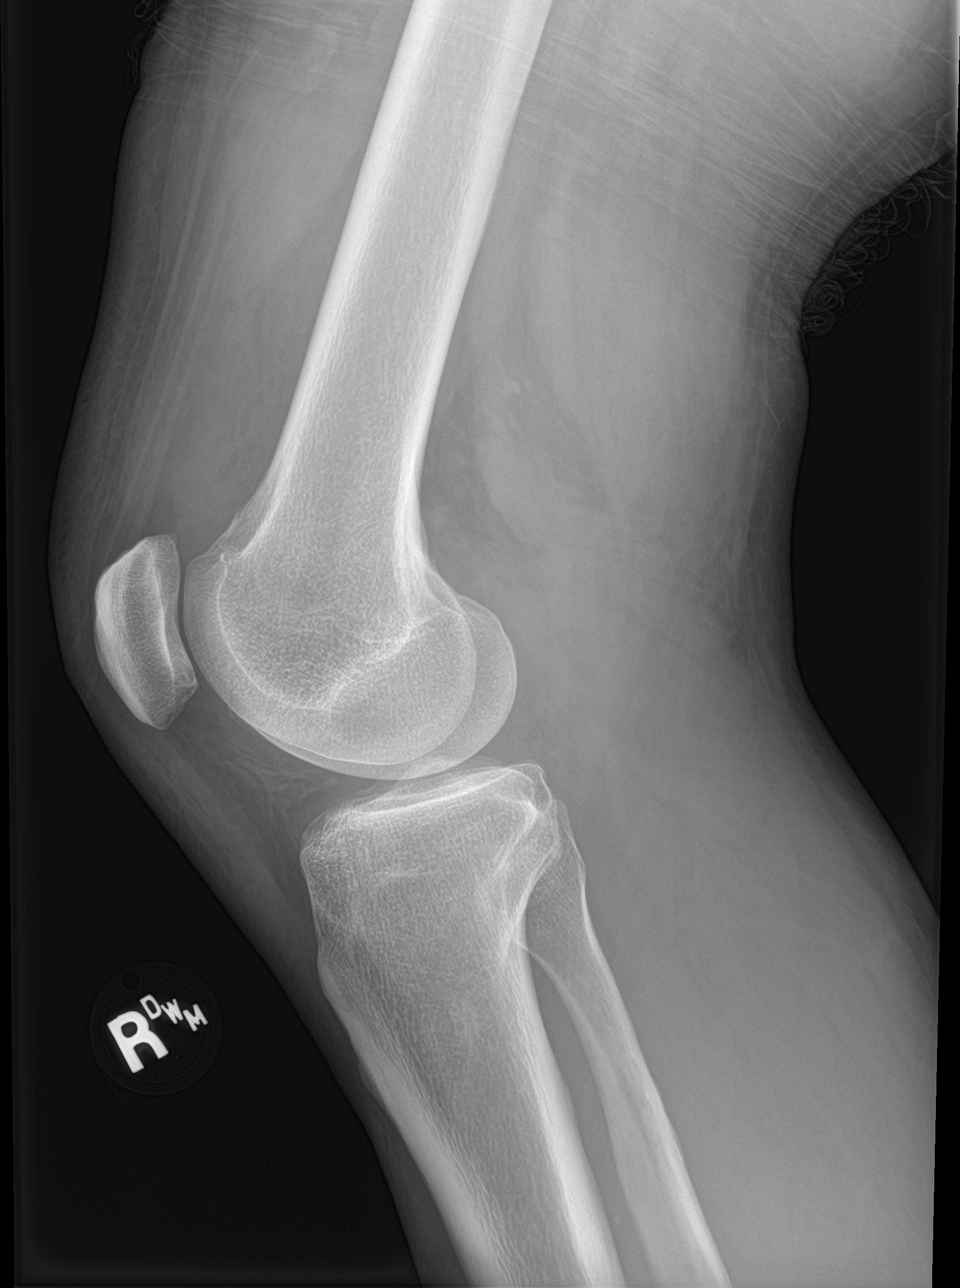

[knee obl (1 of 2)]
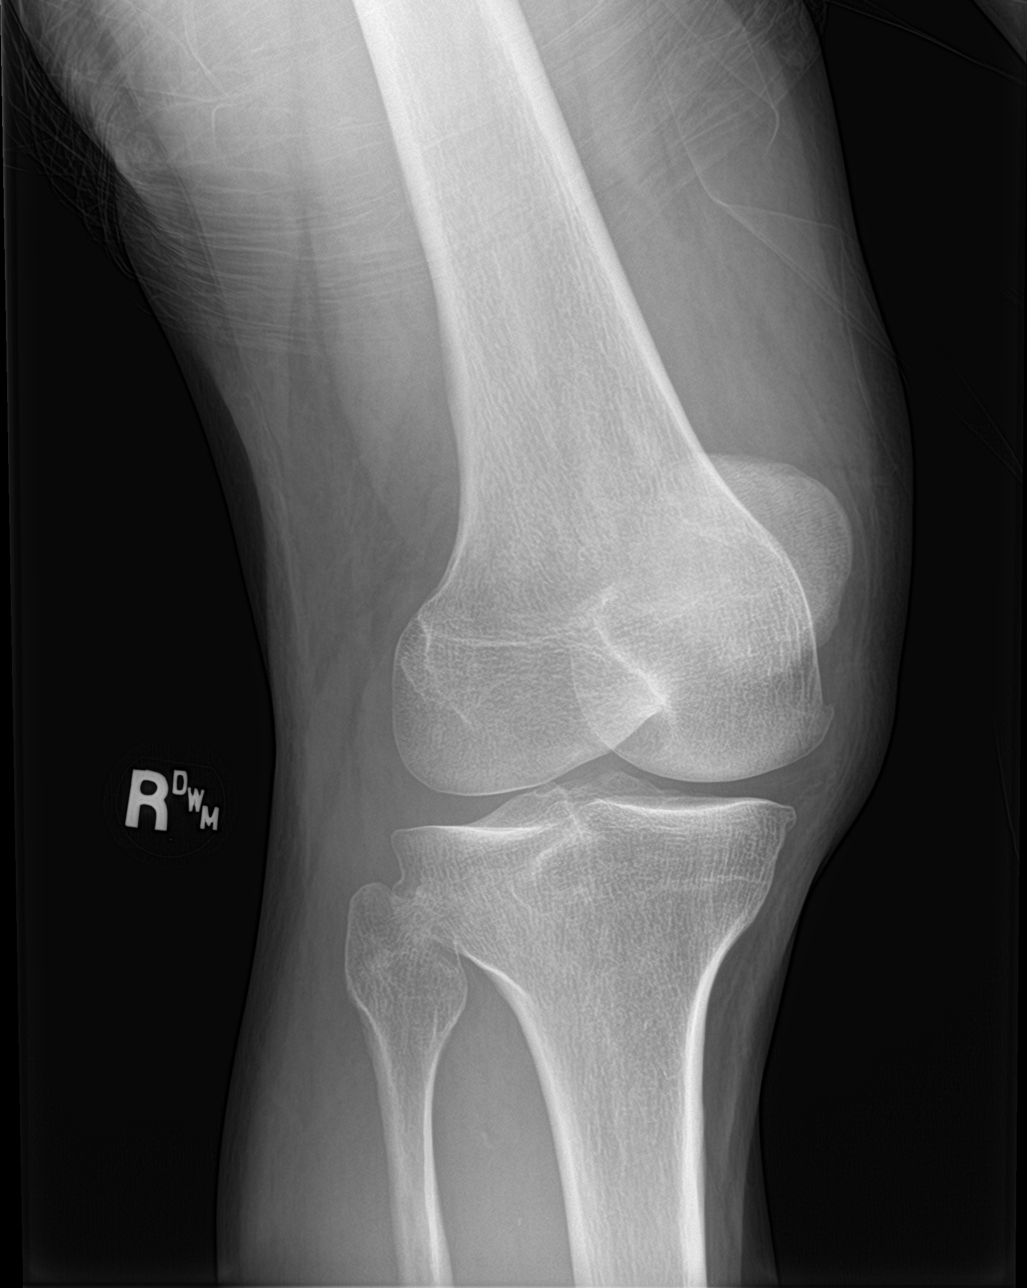

[knee obl (2 of 2)]
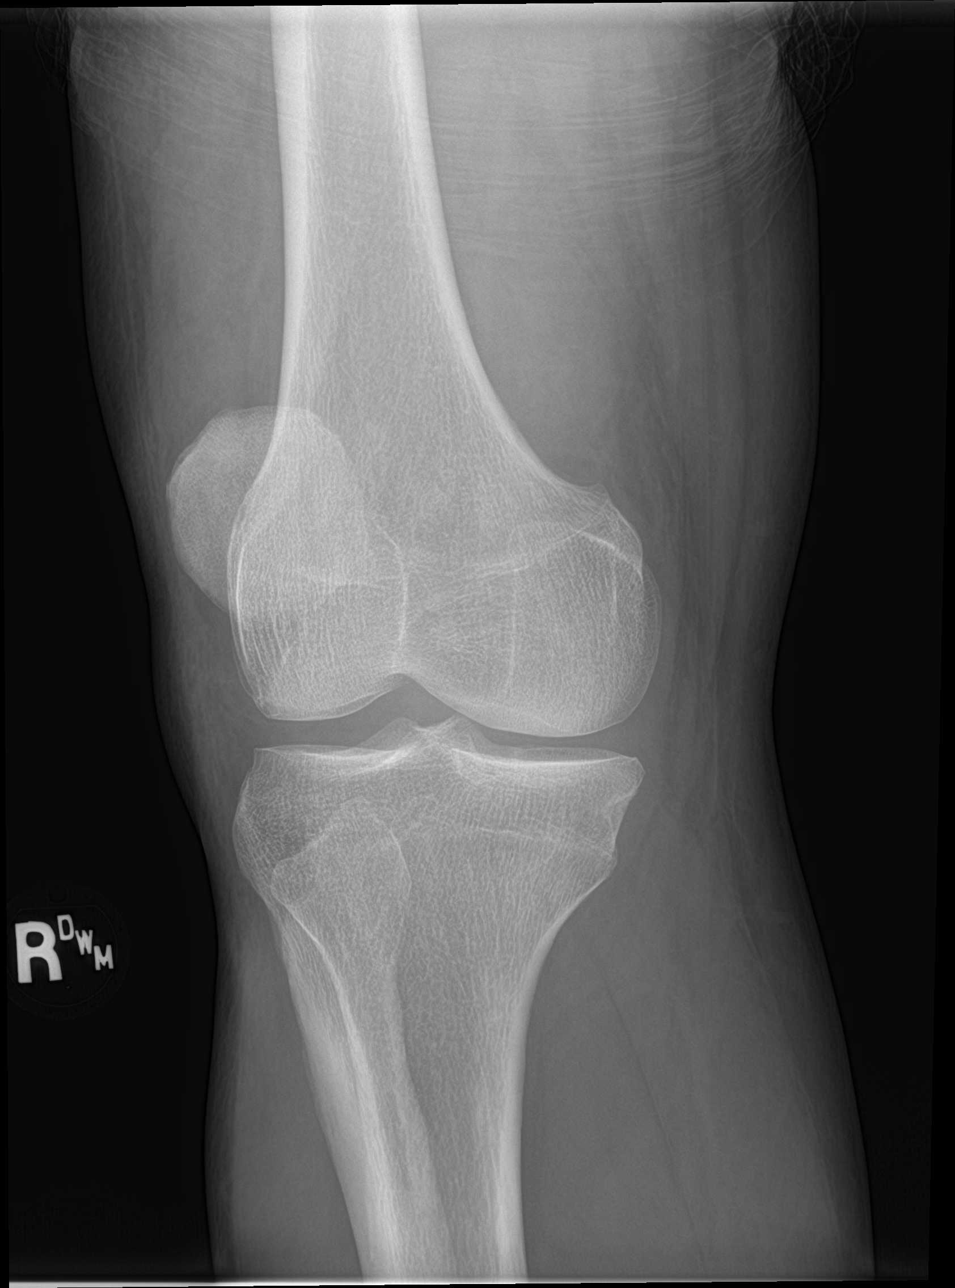

[4 of 4 positions shown; findings below may reference images not displayed]

FINDINGS: Very mild medial tibiofemoral joint degenerative changes.

Minimal patellofemoral joint degenerative changes.

Small joint effusion.

No fracture or dislocation.
IMPRESSION: Very mild medial tibiofemoral joint degenerative changes.

Minimal patellofemoral joint degenerative changes.

Small joint effusion.

## 2022-03-21 ENCOUNTER — Other Ambulatory Visit (HOSPITAL_COMMUNITY): Payer: Self-pay
